# Patient Record
Sex: Female | Born: 2000
Health system: Southern US, Community
[De-identification: ages and names within clinical notes are randomized; demographics above are authoritative.]

## PROBLEM LIST (undated history)

## (undated) DIAGNOSIS — F419 Anxiety disorder, unspecified: Secondary | ICD-10-CM

## (undated) DIAGNOSIS — M25311 Other instability, right shoulder: Secondary | ICD-10-CM

## (undated) DIAGNOSIS — F909 Attention-deficit hyperactivity disorder, unspecified type: Secondary | ICD-10-CM

## (undated) DIAGNOSIS — M25312 Other instability, left shoulder: Secondary | ICD-10-CM

## (undated) HISTORY — DX: Attention-deficit hyperactivity disorder, unspecified type: F90.9

## (undated) HISTORY — DX: Other instability, left shoulder: M25.312

## (undated) HISTORY — DX: Other instability, right shoulder: M25.311

---
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
## 2021-01-05 DIAGNOSIS — M25552 Pain in left hip: Secondary | ICD-10-CM | POA: Diagnosis not present

## 2021-01-05 DIAGNOSIS — M62552 Muscle wasting and atrophy, not elsewhere classified, left thigh: Secondary | ICD-10-CM | POA: Diagnosis not present

## 2021-01-05 DIAGNOSIS — M545 Low back pain, unspecified: Secondary | ICD-10-CM | POA: Diagnosis not present

## 2021-01-05 DIAGNOSIS — M25511 Pain in right shoulder: Secondary | ICD-10-CM | POA: Diagnosis not present

## 2021-01-09 DIAGNOSIS — F329 Major depressive disorder, single episode, unspecified: Secondary | ICD-10-CM | POA: Diagnosis not present

## 2021-01-09 DIAGNOSIS — F410 Panic disorder [episodic paroxysmal anxiety] without agoraphobia: Secondary | ICD-10-CM | POA: Diagnosis not present

## 2021-01-09 DIAGNOSIS — F411 Generalized anxiety disorder: Secondary | ICD-10-CM | POA: Diagnosis not present

## 2021-01-09 DIAGNOSIS — F900 Attention-deficit hyperactivity disorder, predominantly inattentive type: Secondary | ICD-10-CM | POA: Diagnosis not present

## 2021-01-11 ENCOUNTER — Other Ambulatory Visit (HOSPITAL_COMMUNITY): Payer: Self-pay

## 2021-01-12 ENCOUNTER — Other Ambulatory Visit (HOSPITAL_COMMUNITY): Payer: Self-pay

## 2021-01-12 ENCOUNTER — Other Ambulatory Visit (HOSPITAL_BASED_OUTPATIENT_CLINIC_OR_DEPARTMENT_OTHER): Payer: Self-pay

## 2021-01-12 MED ORDER — SERTRALINE HCL 25 MG PO TABS
25.0000 mg | ORAL_TABLET | Freq: Every day | ORAL | 0 refills | Status: AC
  Filled 2021-01-12 (×2): qty 90, 90d supply, fill #0

## 2021-01-12 MED ORDER — SERTRALINE HCL 100 MG PO TABS
100.0000 mg | ORAL_TABLET | Freq: Every day | ORAL | 0 refills | Status: AC
  Filled 2021-01-12 (×2): qty 90, 90d supply, fill #0

## 2021-02-08 DIAGNOSIS — F329 Major depressive disorder, single episode, unspecified: Secondary | ICD-10-CM | POA: Diagnosis not present

## 2021-02-08 DIAGNOSIS — F900 Attention-deficit hyperactivity disorder, predominantly inattentive type: Secondary | ICD-10-CM | POA: Diagnosis not present

## 2021-02-08 DIAGNOSIS — F411 Generalized anxiety disorder: Secondary | ICD-10-CM | POA: Diagnosis not present

## 2021-02-08 DIAGNOSIS — F410 Panic disorder [episodic paroxysmal anxiety] without agoraphobia: Secondary | ICD-10-CM | POA: Diagnosis not present

## 2021-02-23 DIAGNOSIS — F900 Attention-deficit hyperactivity disorder, predominantly inattentive type: Secondary | ICD-10-CM | POA: Diagnosis not present

## 2021-02-23 DIAGNOSIS — F411 Generalized anxiety disorder: Secondary | ICD-10-CM | POA: Diagnosis not present

## 2021-02-23 DIAGNOSIS — F329 Major depressive disorder, single episode, unspecified: Secondary | ICD-10-CM | POA: Diagnosis not present

## 2021-02-23 DIAGNOSIS — F410 Panic disorder [episodic paroxysmal anxiety] without agoraphobia: Secondary | ICD-10-CM | POA: Diagnosis not present

## 2021-03-08 DIAGNOSIS — F900 Attention-deficit hyperactivity disorder, predominantly inattentive type: Secondary | ICD-10-CM | POA: Diagnosis not present

## 2021-03-08 DIAGNOSIS — F410 Panic disorder [episodic paroxysmal anxiety] without agoraphobia: Secondary | ICD-10-CM | POA: Diagnosis not present

## 2021-03-08 DIAGNOSIS — F411 Generalized anxiety disorder: Secondary | ICD-10-CM | POA: Diagnosis not present

## 2021-03-08 DIAGNOSIS — F329 Major depressive disorder, single episode, unspecified: Secondary | ICD-10-CM | POA: Diagnosis not present

## 2021-03-22 DIAGNOSIS — F329 Major depressive disorder, single episode, unspecified: Secondary | ICD-10-CM | POA: Diagnosis not present

## 2021-03-22 DIAGNOSIS — F410 Panic disorder [episodic paroxysmal anxiety] without agoraphobia: Secondary | ICD-10-CM | POA: Diagnosis not present

## 2021-03-22 DIAGNOSIS — F411 Generalized anxiety disorder: Secondary | ICD-10-CM | POA: Diagnosis not present

## 2021-03-22 DIAGNOSIS — F900 Attention-deficit hyperactivity disorder, predominantly inattentive type: Secondary | ICD-10-CM | POA: Diagnosis not present

## 2021-04-12 DIAGNOSIS — F411 Generalized anxiety disorder: Secondary | ICD-10-CM | POA: Diagnosis not present

## 2021-04-12 DIAGNOSIS — F900 Attention-deficit hyperactivity disorder, predominantly inattentive type: Secondary | ICD-10-CM | POA: Diagnosis not present

## 2021-04-12 DIAGNOSIS — F329 Major depressive disorder, single episode, unspecified: Secondary | ICD-10-CM | POA: Diagnosis not present

## 2021-04-12 DIAGNOSIS — F410 Panic disorder [episodic paroxysmal anxiety] without agoraphobia: Secondary | ICD-10-CM | POA: Diagnosis not present

## 2021-04-24 ENCOUNTER — Other Ambulatory Visit (HOSPITAL_BASED_OUTPATIENT_CLINIC_OR_DEPARTMENT_OTHER): Payer: Self-pay

## 2021-04-24 DIAGNOSIS — R300 Dysuria: Secondary | ICD-10-CM | POA: Diagnosis not present

## 2021-04-24 DIAGNOSIS — B379 Candidiasis, unspecified: Secondary | ICD-10-CM | POA: Diagnosis not present

## 2021-04-24 MED ORDER — SERTRALINE HCL 25 MG PO TABS
ORAL_TABLET | ORAL | 0 refills | Status: AC
  Filled 2021-04-24: qty 90, 90d supply, fill #0

## 2021-04-24 MED ORDER — SERTRALINE HCL 100 MG PO TABS
100.0000 mg | ORAL_TABLET | Freq: Every day | ORAL | 0 refills | Status: AC
  Filled 2021-04-24: qty 90, 90d supply, fill #0

## 2021-04-24 MED ORDER — LAMOTRIGINE 100 MG PO TABS
ORAL_TABLET | ORAL | 0 refills | Status: DC
  Filled 2021-04-24: qty 60, 30d supply, fill #0

## 2021-05-08 DIAGNOSIS — F410 Panic disorder [episodic paroxysmal anxiety] without agoraphobia: Secondary | ICD-10-CM | POA: Diagnosis not present

## 2021-05-08 DIAGNOSIS — F329 Major depressive disorder, single episode, unspecified: Secondary | ICD-10-CM | POA: Diagnosis not present

## 2021-05-08 DIAGNOSIS — F411 Generalized anxiety disorder: Secondary | ICD-10-CM | POA: Diagnosis not present

## 2021-05-08 DIAGNOSIS — F900 Attention-deficit hyperactivity disorder, predominantly inattentive type: Secondary | ICD-10-CM | POA: Diagnosis not present

## 2021-05-22 ENCOUNTER — Other Ambulatory Visit (HOSPITAL_BASED_OUTPATIENT_CLINIC_OR_DEPARTMENT_OTHER): Payer: Self-pay

## 2021-05-22 DIAGNOSIS — F329 Major depressive disorder, single episode, unspecified: Secondary | ICD-10-CM | POA: Diagnosis not present

## 2021-05-22 DIAGNOSIS — F411 Generalized anxiety disorder: Secondary | ICD-10-CM | POA: Diagnosis not present

## 2021-05-22 DIAGNOSIS — F410 Panic disorder [episodic paroxysmal anxiety] without agoraphobia: Secondary | ICD-10-CM | POA: Diagnosis not present

## 2021-05-22 DIAGNOSIS — F900 Attention-deficit hyperactivity disorder, predominantly inattentive type: Secondary | ICD-10-CM | POA: Diagnosis not present

## 2021-05-22 MED ORDER — LAMOTRIGINE 100 MG PO TABS
100.0000 mg | ORAL_TABLET | Freq: Two times a day (BID) | ORAL | 0 refills | Status: AC
  Filled 2021-05-22: qty 180, 90d supply, fill #0

## 2021-05-31 DIAGNOSIS — F329 Major depressive disorder, single episode, unspecified: Secondary | ICD-10-CM | POA: Diagnosis not present

## 2021-05-31 DIAGNOSIS — F900 Attention-deficit hyperactivity disorder, predominantly inattentive type: Secondary | ICD-10-CM | POA: Diagnosis not present

## 2021-05-31 DIAGNOSIS — F411 Generalized anxiety disorder: Secondary | ICD-10-CM | POA: Diagnosis not present

## 2021-05-31 DIAGNOSIS — F410 Panic disorder [episodic paroxysmal anxiety] without agoraphobia: Secondary | ICD-10-CM | POA: Diagnosis not present

## 2021-06-22 DIAGNOSIS — H52223 Regular astigmatism, bilateral: Secondary | ICD-10-CM | POA: Diagnosis not present

## 2021-07-06 DIAGNOSIS — F411 Generalized anxiety disorder: Secondary | ICD-10-CM | POA: Diagnosis not present

## 2021-07-06 DIAGNOSIS — F410 Panic disorder [episodic paroxysmal anxiety] without agoraphobia: Secondary | ICD-10-CM | POA: Diagnosis not present

## 2021-07-06 DIAGNOSIS — F3181 Bipolar II disorder: Secondary | ICD-10-CM | POA: Diagnosis not present

## 2021-07-06 DIAGNOSIS — F900 Attention-deficit hyperactivity disorder, predominantly inattentive type: Secondary | ICD-10-CM | POA: Diagnosis not present

## 2021-07-19 ENCOUNTER — Other Ambulatory Visit (HOSPITAL_BASED_OUTPATIENT_CLINIC_OR_DEPARTMENT_OTHER): Payer: Self-pay

## 2021-07-19 DIAGNOSIS — R55 Syncope and collapse: Secondary | ICD-10-CM | POA: Diagnosis not present

## 2021-07-19 MED ORDER — SERTRALINE HCL 50 MG PO TABS
75.0000 mg | ORAL_TABLET | Freq: Every day | ORAL | 0 refills | Status: AC
  Filled 2021-07-19: qty 135, 90d supply, fill #0

## 2021-07-28 ENCOUNTER — Other Ambulatory Visit (HOSPITAL_BASED_OUTPATIENT_CLINIC_OR_DEPARTMENT_OTHER): Payer: Self-pay
